# Patient Record
Sex: Male | Born: 2000 | Hispanic: Yes | Marital: Single | Smoking: Never smoker
Health system: Southern US, Community
[De-identification: ages and names within clinical notes are randomized; demographics above are authoritative.]

---

## 2018-08-03 ENCOUNTER — Emergency Department (HOSPITAL_COMMUNITY)
Admission: EM | Admit: 2018-08-03 | Discharge: 2018-08-03 | Disposition: A | Discharge status: Home / Self Care | Payer: Medicaid Other | Attending: Emergency Medicine | Admitting: Emergency Medicine

## 2018-08-03 ENCOUNTER — Emergency Department (HOSPITAL_COMMUNITY): Payer: Medicaid Other

## 2018-08-03 ENCOUNTER — Encounter (HOSPITAL_COMMUNITY): Payer: Self-pay | Admitting: Emergency Medicine

## 2018-08-03 ENCOUNTER — Other Ambulatory Visit: Payer: Self-pay

## 2018-08-03 DIAGNOSIS — Y9389 Activity, other specified: Secondary | ICD-10-CM | POA: Insufficient documentation

## 2018-08-03 DIAGNOSIS — S8251XA Displaced fracture of medial malleolus of right tibia, initial encounter for closed fracture: Secondary | ICD-10-CM | POA: Insufficient documentation

## 2018-08-03 DIAGNOSIS — Y998 Other external cause status: Secondary | ICD-10-CM | POA: Insufficient documentation

## 2018-08-03 DIAGNOSIS — S82831A Other fracture of upper and lower end of right fibula, initial encounter for closed fracture: Secondary | ICD-10-CM | POA: Insufficient documentation

## 2018-08-03 DIAGNOSIS — R52 Pain, unspecified: Secondary | ICD-10-CM

## 2018-08-03 DIAGNOSIS — S9304XA Dislocation of right ankle joint, initial encounter: Secondary | ICD-10-CM

## 2018-08-03 DIAGNOSIS — Y929 Unspecified place or not applicable: Secondary | ICD-10-CM | POA: Insufficient documentation

## 2018-08-03 DIAGNOSIS — S82891A Other fracture of right lower leg, initial encounter for closed fracture: Secondary | ICD-10-CM

## 2018-08-03 MED ORDER — HYDROCODONE-ACETAMINOPHEN 5-325 MG PO TABS
1.0000 | ORAL_TABLET | Freq: Once | ORAL | Status: AC
  Administered 2018-08-03: 1 via ORAL
  Filled 2018-08-03: qty 1

## 2018-08-03 MED ORDER — PROPOFOL 10 MG/ML IV BOLUS
0.5000 mg/kg | Freq: Once | INTRAVENOUS | Status: DC
  Filled 2018-08-03: qty 20

## 2018-08-03 MED ORDER — HYDROCODONE-ACETAMINOPHEN 5-325 MG PO TABS: 1.0000 | ORAL_TABLET | Freq: Four times a day (QID) | ORAL | 0 refills | Status: DC | PRN

## 2018-08-03 MED ORDER — PROPOFOL 10 MG/ML IV BOLUS
INTRAVENOUS | Status: AC | PRN
  Administered 2018-08-03: 20 mg via INTRAVENOUS
  Administered 2018-08-03: 40 mg via INTRAVENOUS
  Administered 2018-08-03: 20 mg via INTRAVENOUS
  Administered 2018-08-03: 40 mg via INTRAVENOUS
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
## 2018-08-03 NOTE — ED Notes (Signed)
Patient verbalizes understanding of discharge instructions. Opportunity for questioning and answers were provided. Armband removed by staff, pt discharged from ED ambulatory with crutches. ° °
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
## 2018-08-04 ENCOUNTER — Other Ambulatory Visit: Payer: Self-pay

## 2018-08-04 ENCOUNTER — Ambulatory Visit: Payer: Self-pay | Admitting: Student

## 2018-08-04 ENCOUNTER — Encounter (HOSPITAL_COMMUNITY): Payer: Self-pay | Admitting: *Deleted

## 2018-08-04 DIAGNOSIS — S82841A Displaced bimalleolar fracture of right lower leg, initial encounter for closed fracture: Secondary | ICD-10-CM | POA: Insufficient documentation
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
## 2018-08-04 NOTE — Progress Notes (Signed)
Spoke with healthy 18 yr old patient regarding his pre-op orders for DOS.  Patient denies SOB, chest pain, fever, cough, n/v.  Patient instructed to STOP now taking any Aspirin (unless otherwise instructed by your surgeon), Aleve, Naproxen, Ibuprofen, Motrin, Advil, Goody's, BC's, all herbal medications, fish oil, and all vitamins.    PCP - denies Cardiologist - denies  Chest x-ray - denies EKG - denies Stress Test -denies  ECHO - denies Cardiac Cath -denies   Sleep Study - denies CPAP - n/a   Coronavirus Screening  Have you or your Mother experienced the following symptoms:  Cough yes/no: No Fever (>100.26F)  yes/no: No Runny nose yes/no: No Sore throat yes/no: No Difficulty breathing/shortness of breath  yes/no: No  Have you or your Mother traveled in the last 14 days and where? yes/no: No  Patient informed of the hospital visitor restriction policy that is now in effect.  Patient verbalized understanding of all pre-op instructions for DOS.

## 2018-08-05 ENCOUNTER — Ambulatory Visit (HOSPITAL_COMMUNITY): Payer: Self-pay

## 2018-08-05 ENCOUNTER — Ambulatory Visit (HOSPITAL_COMMUNITY): Payer: Self-pay | Admitting: Anesthesiology

## 2018-08-05 ENCOUNTER — Encounter (HOSPITAL_COMMUNITY): Payer: Self-pay

## 2018-08-05 ENCOUNTER — Ambulatory Visit (HOSPITAL_COMMUNITY)
Admission: RE | Admit: 2018-08-05 | Discharge: 2018-08-05 | Disposition: A | Discharge status: Home / Self Care | Payer: Self-pay | Attending: Student | Admitting: Student

## 2018-08-05 ENCOUNTER — Encounter (HOSPITAL_COMMUNITY)
Admission: RE | Disposition: A | Discharge status: Home / Self Care | Payer: Self-pay | Source: Home / Self Care | Attending: Student

## 2018-08-05 ENCOUNTER — Other Ambulatory Visit: Payer: Self-pay

## 2018-08-05 DIAGNOSIS — S82841A Displaced bimalleolar fracture of right lower leg, initial encounter for closed fracture: Secondary | ICD-10-CM | POA: Insufficient documentation

## 2018-08-05 DIAGNOSIS — T148XXA Other injury of unspecified body region, initial encounter: Secondary | ICD-10-CM

## 2018-08-05 DIAGNOSIS — S93431A Sprain of tibiofibular ligament of right ankle, initial encounter: Secondary | ICD-10-CM | POA: Insufficient documentation

## 2018-08-05 HISTORY — PX: ORIF ANKLE FRACTURE: SHX5408

## 2018-08-05 LAB — CBC
HCT: 46.4 % (ref 39.0–52.0)
Hemoglobin: 14.9 g/dL (ref 13.0–17.0)
MCH: 28.4 pg (ref 26.0–34.0)
MCHC: 32.1 g/dL (ref 30.0–36.0)
MCV: 88.5 fL (ref 80.0–100.0)
Platelets: 199 10*3/uL (ref 150–400)
RBC: 5.24 MIL/uL (ref 4.22–5.81)
RDW: 11.5 % (ref 11.5–15.5)
WBC: 8.5 10*3/uL (ref 4.0–10.5)
nRBC: 0 % (ref 0.0–0.2)

## 2018-08-05 SURGERY — OPEN REDUCTION INTERNAL FIXATION (ORIF) ANKLE FRACTURE
Anesthesia: Regional | Laterality: Right

## 2018-08-05 MED ORDER — KETOROLAC TROMETHAMINE 30 MG/ML IJ SOLN: 30.0000 mg | Freq: Once | INTRAMUSCULAR | Status: DC | PRN

## 2018-08-05 MED ORDER — LIDOCAINE-EPINEPHRINE (PF) 1.5 %-1:200000 IJ SOLN
INTRAMUSCULAR | Status: DC | PRN
  Administered 2018-08-05: 15 mL via PERINEURAL

## 2018-08-05 MED ORDER — DEXAMETHASONE SODIUM PHOSPHATE 10 MG/ML IJ SOLN
INTRAMUSCULAR | Status: DC | PRN
  Administered 2018-08-05: 10 mg via INTRAVENOUS

## 2018-08-05 MED ORDER — FENTANYL CITRATE (PF) 100 MCG/2ML IJ SOLN
100.0000 ug | Freq: Once | INTRAMUSCULAR | Status: AC
  Administered 2018-08-05: 10:00:00 100 ug via INTRAVENOUS

## 2018-08-05 MED ORDER — OXYCODONE HCL 5 MG PO TABS: 5.0000 mg | ORAL_TABLET | ORAL | Status: DC | PRN

## 2018-08-05 MED ORDER — LACTATED RINGERS IV SOLN
INTRAVENOUS | Status: DC | PRN
  Administered 2018-08-05 (×2): via INTRAVENOUS

## 2018-08-05 MED ORDER — 0.9 % SODIUM CHLORIDE (POUR BTL) OPTIME
TOPICAL | Status: DC | PRN
  Administered 2018-08-05: 1000 mL

## 2018-08-05 MED ORDER — MIDAZOLAM HCL 2 MG/2ML IJ SOLN
INTRAMUSCULAR | Status: AC
  Filled 2018-08-05: qty 2

## 2018-08-05 MED ORDER — PROPOFOL 10 MG/ML IV BOLUS
INTRAVENOUS | Status: DC | PRN
  Administered 2018-08-05: 200 mg via INTRAVENOUS

## 2018-08-05 MED ORDER — ONDANSETRON HCL 4 MG/2ML IJ SOLN
INTRAMUSCULAR | Status: AC
  Filled 2018-08-05: qty 2

## 2018-08-05 MED ORDER — BACITRACIN ZINC 500 UNIT/GM EX OINT
TOPICAL_OINTMENT | CUTANEOUS | Status: AC
  Filled 2018-08-05: qty 28.35

## 2018-08-05 MED ORDER — ACETAMINOPHEN 500 MG PO TABS: 500.0000 mg | ORAL_TABLET | Freq: Two times a day (BID) | ORAL | 1 refills | Status: AC

## 2018-08-05 MED ORDER — OXYCODONE HCL 5 MG PO TABS: 5.0000 mg | ORAL_TABLET | ORAL | 0 refills | Status: AC | PRN

## 2018-08-05 MED ORDER — POVIDONE-IODINE 10 % EX SWAB
2.0000 "application " | Freq: Once | CUTANEOUS | Status: AC
  Administered 2018-08-05: 2 via TOPICAL

## 2018-08-05 MED ORDER — VANCOMYCIN HCL 1000 MG IV SOLR
INTRAVENOUS | Status: DC | PRN
  Administered 2018-08-05: 1000 mg via TOPICAL

## 2018-08-05 MED ORDER — PROPOFOL 10 MG/ML IV BOLUS
INTRAVENOUS | Status: AC
  Filled 2018-08-05: qty 20

## 2018-08-05 MED ORDER — DEXAMETHASONE SODIUM PHOSPHATE 10 MG/ML IJ SOLN
INTRAMUSCULAR | Status: AC
  Filled 2018-08-05: qty 1

## 2018-08-05 MED ORDER — CHLORHEXIDINE GLUCONATE 4 % EX LIQD: 60.0000 mL | Freq: Once | CUTANEOUS | Status: DC

## 2018-08-05 MED ORDER — BUPIVACAINE HCL (PF) 0.5 % IJ SOLN
INTRAMUSCULAR | Status: AC
  Filled 2018-08-05: qty 30

## 2018-08-05 MED ORDER — SUCCINYLCHOLINE CHLORIDE 200 MG/10ML IV SOSY
PREFILLED_SYRINGE | INTRAVENOUS | Status: DC | PRN
  Administered 2018-08-05: 100 mg via INTRAVENOUS

## 2018-08-05 MED ORDER — SUCCINYLCHOLINE CHLORIDE 200 MG/10ML IV SOSY
PREFILLED_SYRINGE | INTRAVENOUS | Status: AC
  Filled 2018-08-05: qty 10

## 2018-08-05 MED ORDER — LIDOCAINE 2% (20 MG/ML) 5 ML SYRINGE
INTRAMUSCULAR | Status: AC
  Filled 2018-08-05: qty 5

## 2018-08-05 MED ORDER — HYDROMORPHONE HCL 1 MG/ML IJ SOLN: 0.2500 mg | INTRAMUSCULAR | Status: DC | PRN

## 2018-08-05 MED ORDER — ROCURONIUM BROMIDE 50 MG/5ML IV SOSY
PREFILLED_SYRINGE | INTRAVENOUS | Status: DC | PRN
  Administered 2018-08-05: 30 mg via INTRAVENOUS

## 2018-08-05 MED ORDER — FENTANYL CITRATE (PF) 100 MCG/2ML IJ SOLN
INTRAMUSCULAR | Status: AC
  Administered 2018-08-05: 10:00:00 100 ug via INTRAVENOUS
  Filled 2018-08-05: qty 2

## 2018-08-05 MED ORDER — ASPIRIN EC 325 MG PO TBEC: 325.0000 mg | DELAYED_RELEASE_TABLET | Freq: Every day | ORAL | 0 refills | Status: AC

## 2018-08-05 MED ORDER — GABAPENTIN 100 MG PO CAPS: 100.0000 mg | ORAL_CAPSULE | Freq: Three times a day (TID) | ORAL | 0 refills | Status: DC

## 2018-08-05 MED ORDER — METHOCARBAMOL 750 MG PO TABS: 750.0000 mg | ORAL_TABLET | Freq: Four times a day (QID) | ORAL | 0 refills | Status: AC | PRN

## 2018-08-05 MED ORDER — ACETAMINOPHEN 500 MG PO TABS: 500.0000 mg | ORAL_TABLET | Freq: Two times a day (BID) | ORAL | Status: DC

## 2018-08-05 MED ORDER — PROMETHAZINE HCL 25 MG/ML IJ SOLN: 6.2500 mg | INTRAMUSCULAR | Status: DC | PRN

## 2018-08-05 MED ORDER — CEFAZOLIN SODIUM-DEXTROSE 2-4 GM/100ML-% IV SOLN
INTRAVENOUS | Status: AC
  Filled 2018-08-05: qty 100

## 2018-08-05 MED ORDER — FENTANYL CITRATE (PF) 250 MCG/5ML IJ SOLN
INTRAMUSCULAR | Status: AC
  Filled 2018-08-05: qty 5

## 2018-08-05 MED ORDER — BACITRACIN 500 UNIT/GM EX OINT
TOPICAL_OINTMENT | CUTANEOUS | Status: DC | PRN
  Administered 2018-08-05: 1 via TOPICAL

## 2018-08-05 MED ORDER — FENTANYL CITRATE (PF) 100 MCG/2ML IJ SOLN
INTRAMUSCULAR | Status: DC | PRN
  Administered 2018-08-05 (×2): 50 ug via INTRAVENOUS

## 2018-08-05 MED ORDER — VANCOMYCIN HCL 1000 MG IV SOLR
INTRAVENOUS | Status: AC
  Filled 2018-08-05: qty 1000

## 2018-08-05 MED ORDER — SUGAMMADEX SODIUM 200 MG/2ML IV SOLN
INTRAVENOUS | Status: DC | PRN
  Administered 2018-08-05: 200 mg via INTRAVENOUS

## 2018-08-05 MED ORDER — EPHEDRINE SULFATE-NACL 50-0.9 MG/10ML-% IV SOSY
PREFILLED_SYRINGE | INTRAVENOUS | Status: DC | PRN
  Administered 2018-08-05 (×2): 10 mg via INTRAVENOUS

## 2018-08-05 MED ORDER — ONDANSETRON HCL 4 MG/2ML IJ SOLN
INTRAMUSCULAR | Status: DC | PRN
  Administered 2018-08-05: 4 mg via INTRAVENOUS

## 2018-08-05 MED ORDER — BUPIVACAINE HCL (PF) 0.5 % IJ SOLN
INTRAMUSCULAR | Status: DC | PRN
  Administered 2018-08-05: 25 mL via PERINEURAL

## 2018-08-05 MED ORDER — MIDAZOLAM HCL 5 MG/5ML IJ SOLN
INTRAMUSCULAR | Status: DC | PRN
  Administered 2018-08-05: 2 mg via INTRAVENOUS

## 2018-08-05 MED ORDER — ROCURONIUM BROMIDE 50 MG/5ML IV SOSY
PREFILLED_SYRINGE | INTRAVENOUS | Status: AC
  Filled 2018-08-05: qty 5

## 2018-08-05 MED ORDER — CEFAZOLIN SODIUM-DEXTROSE 2-4 GM/100ML-% IV SOLN
2.0000 g | INTRAVENOUS | Status: AC
  Administered 2018-08-05: 2 g via INTRAVENOUS

## 2018-08-05 MED ORDER — LACTATED RINGERS IV SOLN
INTRAVENOUS | Status: DC
  Administered 2018-08-05: 08:00:00 via INTRAVENOUS

## 2018-08-05 MED ORDER — LIDOCAINE 2% (20 MG/ML) 5 ML SYRINGE
INTRAMUSCULAR | Status: DC | PRN
  Administered 2018-08-05: 60 mg via INTRAVENOUS

## 2018-08-05 MED ORDER — DEXMEDETOMIDINE HCL 200 MCG/2ML IV SOLN
INTRAVENOUS | Status: DC | PRN
  Administered 2018-08-05 (×2): 4 ug via INTRAVENOUS

## 2018-08-05 MED ORDER — MIDAZOLAM HCL 2 MG/2ML IJ SOLN
2.0000 mg | Freq: Once | INTRAMUSCULAR | Status: AC
  Administered 2018-08-05: 2 mg via INTRAVENOUS

## 2018-08-05 SURGICAL SUPPLY — 68 items
BANDAGE ACE 4X5 VEL STRL LF (GAUZE/BANDAGES/DRESSINGS) ×3 IMPLANT
BANDAGE ACE 6X5 VEL STRL LF (GAUZE/BANDAGES/DRESSINGS) IMPLANT
BANDAGE ESMARK 6X9 LF (GAUZE/BANDAGES/DRESSINGS) IMPLANT
BIT DRILL 2.5 X LONG (BIT) ×1
BIT DRILL 2.5X110 QC LCP DISP (BIT) ×3 IMPLANT
BIT DRILL PERC QC 2.8X200 100 (BIT) ×1 IMPLANT
BIT DRILL X LONG 2.5 (BIT) ×1 IMPLANT
BNDG COHESIVE 4X5 TAN STRL (GAUZE/BANDAGES/DRESSINGS) ×3 IMPLANT
BNDG ELASTIC 6X10 VLCR STRL LF (GAUZE/BANDAGES/DRESSINGS) ×3 IMPLANT
BNDG ESMARK 6X9 LF (GAUZE/BANDAGES/DRESSINGS)
BRUSH SCRUB SURG 4.25 DISP (MISCELLANEOUS) ×6 IMPLANT
CHLORAPREP W/TINT 26ML (MISCELLANEOUS) ×6 IMPLANT
COVER SURGICAL LIGHT HANDLE (MISCELLANEOUS) ×3 IMPLANT
COVER WAND RF STERILE (DRAPES) IMPLANT
DRAPE C-ARM 42X72 X-RAY (DRAPES) ×2 IMPLANT
DRAPE C-ARMOR (DRAPES) ×3 IMPLANT
DRAPE ORTHO SPLIT 77X108 STRL (DRAPES) ×4
DRAPE SURG ORHT 6 SPLT 77X108 (DRAPES) ×2 IMPLANT
DRAPE U-SHAPE 47X51 STRL (DRAPES) ×3 IMPLANT
DRILL BIT QUICK COUP 2.8MM 100 (BIT) ×2
DRILL BIT X LONG 2.5 (BIT) ×2
DRSG ADAPTIC 3X8 NADH LF (GAUZE/BANDAGES/DRESSINGS) ×3 IMPLANT
ELECT REM PT RETURN 9FT ADLT (ELECTROSURGICAL) ×3
ELECTRODE REM PT RTRN 9FT ADLT (ELECTROSURGICAL) ×1 IMPLANT
GAUZE SPONGE 4X4 12PLY STRL (GAUZE/BANDAGES/DRESSINGS) IMPLANT
GAUZE SPONGE 4X4 12PLY STRL LF (GAUZE/BANDAGES/DRESSINGS) ×3 IMPLANT
GLOVE BIO SURGEON STRL SZ 6.5 (GLOVE) ×6 IMPLANT
GLOVE BIO SURGEON STRL SZ7.5 (GLOVE) ×9 IMPLANT
GLOVE BIO SURGEONS STRL SZ 6.5 (GLOVE) ×3
GLOVE BIOGEL PI IND STRL 6.5 (GLOVE) ×1 IMPLANT
GLOVE BIOGEL PI IND STRL 7.5 (GLOVE) ×1 IMPLANT
GLOVE BIOGEL PI INDICATOR 6.5 (GLOVE) ×2
GLOVE BIOGEL PI INDICATOR 7.5 (GLOVE) ×2
GOWN STRL REUS W/ TWL LRG LVL3 (GOWN DISPOSABLE) ×2 IMPLANT
GOWN STRL REUS W/TWL LRG LVL3 (GOWN DISPOSABLE) ×4
KIT TURNOVER KIT B (KITS) ×3 IMPLANT
MANIFOLD NEPTUNE II (INSTRUMENTS) IMPLANT
NEEDLE 25GAX1.5 (MISCELLANEOUS) ×3 IMPLANT
NEEDLE HYPO 21X1.5 SAFETY (NEEDLE) IMPLANT
NS IRRIG 1000ML POUR BTL (IV SOLUTION) ×3 IMPLANT
PACK TOTAL JOINT (CUSTOM PROCEDURE TRAY) ×3 IMPLANT
PAD ABD 8X10 STRL (GAUZE/BANDAGES/DRESSINGS) ×3 IMPLANT
PAD ARMBOARD 7.5X6 YLW CONV (MISCELLANEOUS) ×6 IMPLANT
PAD CAST 4YDX4 CTTN HI CHSV (CAST SUPPLIES) ×2 IMPLANT
PADDING CAST COTTON 4X4 STRL (CAST SUPPLIES) ×4
PADDING CAST COTTON 6X4 STRL (CAST SUPPLIES) IMPLANT
PLATE LCP 3.5 1/3 TUB 6HX69 (Plate) ×3 IMPLANT
SCREW CANC FT 4.0X50 (Screw) ×3 IMPLANT
SCREW CORTEX 3.5 16MM (Screw) ×2 IMPLANT
SCREW CORTEX 3.5 18MM (Screw) ×4 IMPLANT
SCREW CORTEX 3.5 20MM (Screw) ×2 IMPLANT
SCREW LOCK CORT ST 3.5X16 (Screw) ×1 IMPLANT
SCREW LOCK CORT ST 3.5X18 (Screw) ×2 IMPLANT
SCREW LOCK CORT ST 3.5X20 (Screw) ×1 IMPLANT
SPONGE LAP 18X18 RF (DISPOSABLE) IMPLANT
STAPLER VISISTAT 35W (STAPLE) ×3 IMPLANT
SUCTION FRAZIER HANDLE 10FR (MISCELLANEOUS) ×2
SUCTION TUBE FRAZIER 10FR DISP (MISCELLANEOUS) ×1 IMPLANT
SUT ETHILON 3 0 PS 1 (SUTURE) ×6 IMPLANT
SUT VIC AB 0 CT1 27 (SUTURE) ×2
SUT VIC AB 0 CT1 27XBRD ANBCTR (SUTURE) ×1 IMPLANT
SUT VIC AB 2-0 CT1 27 (SUTURE) ×4
SUT VIC AB 2-0 CT1 TAPERPNT 27 (SUTURE) ×2 IMPLANT
SYR CONTROL 10ML LL (SYRINGE) ×3 IMPLANT
TOWEL OR 17X24 6PK STRL BLUE (TOWEL DISPOSABLE) ×3 IMPLANT
TOWEL OR 17X26 10 PK STRL BLUE (TOWEL DISPOSABLE) ×6 IMPLANT
UNDERPAD 30X30 (UNDERPADS AND DIAPERS) ×3 IMPLANT
WATER STERILE IRR 1000ML POUR (IV SOLUTION) IMPLANT

## 2018-08-05 NOTE — Discharge Instructions (Addendum)
Orthopaedic Trauma Service Discharge Instructions   General Discharge Instructions  WEIGHT BEARING STATUS: Non weightbearing right leg  RANGE OF MOTION/ACTIVITY: Full knee range of motion  Wound Care: Do not remove splint until follow up   DVT/PE prophylaxis: Aspirin 325 mg daily  Diet: as you were eating previously.  Can use over the counter stool softeners and bowel preparations, such as Miralax, to help with bowel movements.  Narcotics can be constipating.  Be sure to drink plenty of fluids  PAIN MEDICATION USE AND EXPECTATIONS  You have likely been given narcotic medications to help control your pain.  After a traumatic event that results in an fracture (broken bone) with or without surgery, it is ok to use narcotic pain medications to help control one's pain.  We understand that everyone responds to pain differently and each individual patient will be evaluated on a regular basis for the continued need for narcotic medications. Ideally, narcotic medication use should last no more than 6-8 weeks (coinciding with fracture healing).   As a patient it is your responsibility as well to monitor narcotic medication use and report the amount and frequency you use these medications when you come to your office visit.   We would also advise that if you are using narcotic medications, you should take a dose prior to therapy to maximize you participation.  IF YOU ARE ON NARCOTIC MEDICATIONS IT IS NOT PERMISSIBLE TO OPERATE A MOTOR VEHICLE (MOTORCYCLE/CAR/TRUCK/MOPED) OR HEAVY MACHINERY DO NOT MIX NARCOTICS WITH OTHER CNS (CENTRAL NERVOUS SYSTEM) DEPRESSANTS SUCH AS ALCOHOL   STOP SMOKING OR USING NICOTINE PRODUCTS!!!!  As discussed nicotine severely impairs your body's ability to heal surgical and traumatic wounds but also impairs bone healing.  Wounds and bone heal by forming microscopic blood vessels (angiogenesis) and nicotine is a vasoconstrictor (essentially, shrinks blood vessels).   Therefore, if vasoconstriction occurs to these microscopic blood vessels they essentially disappear and are unable to deliver necessary nutrients to the healing tissue.  This is one modifiable factor that you can do to dramatically increase your chances of healing your injury.    (This means no smoking, no nicotine gum, patches, etc)  DO NOT USE NONSTEROIDAL ANTI-INFLAMMATORY DRUGS (NSAID'S)  Using products such as Advil (ibuprofen), Aleve (naproxen), Motrin (ibuprofen) for additional pain control during fracture healing can delay and/or prevent the healing response.  If you would like to take over the counter (OTC) medication, Tylenol (acetaminophen) is ok.  However, some narcotic medications that are given for pain control contain acetaminophen as well. Therefore, you should not exceed more than 4000 mg of tylenol in a day if you do not have liver disease.  Also note that there are may OTC medicines, such as cold medicines and allergy medicines that my contain tylenol as well.  If you have any questions about medications and/or interactions please ask your doctor/PA or your pharmacist.      ICE AND ELEVATE INJURED/OPERATIVE EXTREMITY  Using ice and elevating the injured extremity above your heart can help with swelling and pain control.  Icing in a pulsatile fashion, such as 20 minutes on and 20 minutes off, can be followed.    Do not place ice directly on skin. Make sure there is a barrier between to skin and the ice pack.    Using frozen items such as frozen peas works well as the conform nicely to the are that needs to be iced.  USE AN ACE WRAP OR TED HOSE FOR SWELLING CONTROL  In addition to icing and elevation, Ace wraps or TED hose are used to help limit and resolve swelling.  It is recommended to use Ace wraps or TED hose until you are informed to stop.    When using Ace Wraps start the wrapping distally (farthest away from the body) and wrap proximally (closer to the body)   Example: If you  had surgery on your leg or thing and you do not have a splint on, start the ace wrap at the toes and work your way up to the thigh        If you had surgery on your upper extremity and do not have a splint on, start the ace wrap at your fingers and work your way up to the upper arm  IF YOU ARE IN A SPLINT OR CAST DO NOT REMOVE IT FOR ANY REASON   If your splint gets wet for any reason please contact the office immediately. You may shower in your splint or cast as long as you keep it dry.  This can be done by wrapping in a cast cover or garbage back (or similar)  Do Not stick any thing down your splint or cast such as pencils, money, or hangers to try and scratch yourself with.  If you feel itchy take benadryl as prescribed on the bottle for itching   CALL THE OFFICE WITH ANY QUESTIONS OR CONCERNS: [phone] or visit our website at [email or web address]      Discharge Wound Care Instructions  Do NOT apply any ointments, solutions or lotions to pin sites or surgical wounds.  These prevent needed drainage and even though solutions like hydrogen peroxide kill bacteria, they also damage cells lining the pin sites that help fight infection.  Applying lotions or ointments can keep the wounds moist and can cause them to breakdown and open up as well. This can increase the risk for infection. When in doubt call the office.  Surgical incisions should be dressed daily.  If any drainage is noted, use one layer of adaptic, then gauze, Kerlix, and an ace wrap.  Once the incision is completely dry and without drainage, it may be left open to air out.  Showering may begin 36-48 hours later.  Cleaning gently with soap and water.  Traumatic wounds should be dressed daily as well.    One layer of adaptic, gauze, Kerlix, then ace wrap.  The adaptic can be discontinued once the draining has ceased    If you have a wet to dry dressing: wet the gauze with saline the squeeze as much saline out so the gauze is  moist (not soaking wet), place moistened gauze over wound, then place a dry gauze over the moist one, followed by Kerlix wrap, then ace wrap.

## 2018-08-05 NOTE — Transfer of Care (Signed)
Immediate Anesthesia Transfer of Care Note  Patient: Randy Dennie Bible  Procedure(s) Performed: OPEN REDUCTION INTERNAL FIXATION (ORIF) ANKLE FRACTURE (Right )  Patient Location: PACU  Anesthesia Type:General and Regional  Level of Consciousness: drowsy and patient cooperative  Airway & Oxygen Therapy: Patient Spontanous Breathing and Patient connected to face mask oxygen  Post-op Assessment: Report given to RN and Post -op Vital signs reviewed and stable  Post vital signs: Reviewed and stable  Last Vitals:  Vitals Value Taken Time  BP 108/81 08/05/2018 12:16 PM  Temp    Pulse 94 08/05/2018 12:18 PM  Resp 16 08/05/2018 12:18 PM  SpO2 100 % 08/05/2018 12:18 PM  Vitals shown include unvalidated device data.  Last Pain:  Vitals:   08/05/18 0742  TempSrc: Oral      Patients Stated Pain Goal: 2 (08/05/18 0737)  Complications: No apparent anesthesia complications

## 2018-08-05 NOTE — Anesthesia Procedure Notes (Signed)
Anesthesia Procedure Image    
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
## 2018-08-05 NOTE — Anesthesia Procedure Notes (Signed)
Procedure Name: Intubation Date/Time: 08/05/2018 10:33 AM Performed by: Julian Reil, CRNA Pre-anesthesia Checklist: Patient identified, Emergency Drugs available, Suction available and Patient being monitored Patient Re-evaluated:Patient Re-evaluated prior to induction Oxygen Delivery Method: Circle system utilized Preoxygenation: Pre-oxygenation with 100% oxygen Induction Type: IV induction and Rapid sequence Laryngoscope Size: Miller and 3 Grade View: Grade I Tube type: Oral Tube size: 7.5 mm Number of attempts: 1 Airway Equipment and Method: Stylet Placement Confirmation: ETT inserted through vocal cords under direct vision,  positive ETCO2 and breath sounds checked- equal and bilateral Secured at: 23 cm Tube secured with: Tape Dental Injury: Teeth and Oropharynx as per pre-operative assessment  Comments: RSI due to Covid-19 pandemic concerns.  4x4s bite block used.
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
## 2018-08-05 NOTE — Anesthesia Procedure Notes (Signed)
Anesthesia Regional Block: Popliteal block   Pre-Anesthetic Checklist: ,, timeout performed, Correct Patient, Correct Site, Correct Laterality, Correct Procedure, Correct Position, site marked, Risks and benefits discussed,  Surgical consent,  Pre-op evaluation,  At surgeon's request and post-op pain management  Laterality: Right  Prep: chloraprep       Needles:  Injection technique: Single-shot  Needle Type: Echogenic Needle     Needle Length: 9cm      Additional Needles:   Procedures:,,,, ultrasound used (permanent image in chart),,,,  Narrative:  Start time: 08/05/2018 9:34 AM End time: 08/05/2018 9:42 AM Injection made incrementally with aspirations every 5 mL.  Performed by: Personally  Anesthesiologist: Eilene Ghazi, MD  Additional Notes: Patient tolerated the procedure well without complications

## 2018-08-05 NOTE — Anesthesia Postprocedure Evaluation (Signed)
Anesthesia Post Note  Patient: Randy Parks  Procedure(s) Performed: OPEN REDUCTION INTERNAL FIXATION (ORIF) ANKLE FRACTURE (Right )     Patient location during evaluation: PACU Anesthesia Type: General Level of consciousness: awake and alert Pain management: pain level controlled Vital Signs Assessment: post-procedure vital signs reviewed and stable Respiratory status: spontaneous breathing, nonlabored ventilation, respiratory function stable and patient connected to nasal cannula oxygen Cardiovascular status: blood pressure returned to baseline and stable Postop Assessment: no apparent nausea or vomiting Anesthetic complications: no    Last Vitals:  Vitals:   08/05/18 1215 08/05/18 1234  BP:  (P) 130/63  Pulse:  (P) 100  Resp:  (P) 14  Temp: (P) 36.7 C   SpO2:      Last Pain:  Vitals:   08/05/18 1234  TempSrc:   PainSc: (P) 0-No pain                 Omega Durante S

## 2018-08-08 ENCOUNTER — Encounter (HOSPITAL_COMMUNITY): Payer: Self-pay | Admitting: Student

## 2018-08-10 ENCOUNTER — Encounter: Payer: Self-pay | Admitting: Student

## 2018-08-10 DIAGNOSIS — S93431A Sprain of tibiofibular ligament of right ankle, initial encounter: Secondary | ICD-10-CM | POA: Insufficient documentation

## 2019-02-10 ENCOUNTER — Emergency Department (HOSPITAL_COMMUNITY)
Admission: EM | Admit: 2019-02-10 | Discharge: 2019-02-10 | Disposition: A | Discharge status: Home / Self Care | Payer: Self-pay | Attending: Emergency Medicine | Admitting: Emergency Medicine

## 2019-02-10 ENCOUNTER — Other Ambulatory Visit: Payer: Self-pay

## 2019-02-10 ENCOUNTER — Encounter (HOSPITAL_COMMUNITY): Payer: Self-pay

## 2019-02-10 ENCOUNTER — Emergency Department (HOSPITAL_COMMUNITY): Payer: Self-pay

## 2019-02-10 DIAGNOSIS — N200 Calculus of kidney: Secondary | ICD-10-CM | POA: Insufficient documentation

## 2019-02-10 LAB — CBC
HCT: 45.4 % (ref 39.0–52.0)
Hemoglobin: 14.9 g/dL (ref 13.0–17.0)
MCH: 29.8 pg (ref 26.0–34.0)
MCHC: 32.8 g/dL (ref 30.0–36.0)
MCV: 90.8 fL (ref 80.0–100.0)
Platelets: 246 10*3/uL (ref 150–400)
RBC: 5 MIL/uL (ref 4.22–5.81)
RDW: 11.8 % (ref 11.5–15.5)
WBC: 4.5 10*3/uL (ref 4.0–10.5)
nRBC: 0 % (ref 0.0–0.2)

## 2019-02-10 LAB — COMPREHENSIVE METABOLIC PANEL
ALT: 28 U/L (ref 0–44)
AST: 36 U/L (ref 15–41)
Albumin: 4.9 g/dL (ref 3.5–5.0)
Alkaline Phosphatase: 82 U/L (ref 38–126)
Anion gap: 11 (ref 5–15)
BUN: 9 mg/dL (ref 6–20)
CO2: 28 mmol/L (ref 22–32)
Calcium: 9.5 mg/dL (ref 8.9–10.3)
Chloride: 100 mmol/L (ref 98–111)
Creatinine, Ser: 0.95 mg/dL (ref 0.61–1.24)
GFR calc Af Amer: >60 mL/min (ref >60)
GFR calc non Af Amer: >60 mL/min (ref >60)
Glucose, Bld: 107 mg/dL — ABNORMAL HIGH (ref 70–99)
Potassium: 3.9 mmol/L (ref 3.5–5.1)
Sodium: 139 mmol/L (ref 135–145)
Total Bilirubin: 0.8 mg/dL (ref 0.3–1.2)
Total Protein: 7.7 g/dL (ref 6.5–8.1)

## 2019-02-10 LAB — URINALYSIS, ROUTINE W REFLEX MICROSCOPIC
Bilirubin Urine: NEGATIVE
Glucose, UA: NEGATIVE mg/dL
Ketones, ur: NEGATIVE mg/dL
Leukocytes,Ua: NEGATIVE
Nitrite: NEGATIVE
Protein, ur: NEGATIVE mg/dL
Specific Gravity, Urine: 1.005 (ref 1.005–1.030)
pH: 7 (ref 5.0–8.0)

## 2019-02-10 LAB — RPR: RPR Ser Ql: NONREACTIVE

## 2019-02-10 LAB — HIV ANTIBODY (ROUTINE TESTING W REFLEX): HIV Screen 4th Generation wRfx: NONREACTIVE

## 2019-02-10 MED ORDER — ONDANSETRON 4 MG PO TBDP: 4.0000 mg | ORAL_TABLET | Freq: Three times a day (TID) | ORAL | 0 refills | Status: AC | PRN

## 2019-02-10 MED ORDER — SODIUM CHLORIDE 0.9% FLUSH
3.0000 mL | Freq: Once | INTRAVENOUS | Status: AC
  Administered 2019-02-10: 09:00:00 3 mL via INTRAVENOUS

## 2019-02-10 MED ORDER — MORPHINE SULFATE (PF) 2 MG/ML IV SOLN
2.0000 mg | Freq: Once | INTRAVENOUS | Status: AC
  Administered 2019-02-10: 2 mg via INTRAVENOUS
  Filled 2019-02-10: qty 1

## 2019-02-10 MED ORDER — KETOROLAC TROMETHAMINE 15 MG/ML IJ SOLN
15.0000 mg | Freq: Once | INTRAMUSCULAR | Status: AC
  Administered 2019-02-10: 15 mg via INTRAVENOUS
  Filled 2019-02-10: qty 1

## 2019-02-10 MED ORDER — SODIUM CHLORIDE 0.9 % IV BOLUS
1000.0000 mL | Freq: Once | INTRAVENOUS | Status: AC
  Administered 2019-02-10: 09:00:00 1000 mL via INTRAVENOUS

## 2019-02-10 MED ORDER — TAMSULOSIN HCL 0.4 MG PO CAPS: 0.4000 mg | ORAL_CAPSULE | Freq: Every day | ORAL | 0 refills | Status: AC

## 2019-02-10 NOTE — Discharge Instructions (Addendum)
You have been diagnosed today with Kidney Stone.  At this time there does not appear to be the presence of an emergent medical condition, however there is always the potential for conditions to change. Please read and follow the below instructions.  Please return to the Emergency Department immediately for any new or worsening symptoms or if your symptoms do not improve within 2 days. Please be sure to follow up with your Primary Care Provider within one week regarding your visit today; please call their office to schedule an appointment even if you are feeling better for a follow-up visit. You have a 5 mm kidney stone on your CT scan today.  Please take the medications Flomax as prescribed to help facilitate stone passage. You may use the medication Zofran as prescribed to help with nausea and vomiting.  Please call the urologist Dr. Jeffie Pollock on your discharge paperwork to schedule a follow-up appointment regarding your kidney stone. You have been given an NSAID-containing medication called Toradol today.  Do not take the medications including ibuprofen, Aleve, Advil, naproxen or other NSAID-containing medications for the next 2 days.  Please be sure to drink plenty of water over the next few days. You have been tested today for gonorrhea and chlamydia as well as HIV and syphilis. These results will be available in approximately 3 days. You may check your MyChart account for results. Please inform all sexual partners of positive results and that they should be tested and treated as well. Please wait 2 weeks and be sure that you and your partners are symptom free before returning to sexual activity. Please use protection with every sexual encounter.  Please follow-up with your primary care provider for treatment of any positive STD test.  Get help right away if: You have a fever or chills. You get very bad pain. You get new pain in your belly (abdomen). You pass out (faint). You have pain or swelling of  your testicles You cannot pee. You have any new/concerning or worsening symptoms  Please read the additional information packets attached to your discharge summary.  Do not take your medicine if  develop an itchy rash, swelling in your mouth or lips, or difficulty breathing; call 911 and seek immediate emergency medical attention if this occurs.  Note: Portions of this text may have been transcribed using voice recognition software. Every effort was made to ensure accuracy; however, inadvertent computerized transcription errors may still be present.

## 2019-02-10 NOTE — ED Provider Notes (Signed)
[facility]-EMERGENCY DEPT Provider Note   CSN: [id number] Arrival date & time: 02/10/19  0754     History   Chief Complaint Chief Complaint  Patient presents with  . Abdominal Pain  . Hematuria    HPI Randy Parks is a 18 y.o. male otherwise healthy presents today for abdominal pain and hematuria.  Patient woke up from sleep around 4 AM with some suprapubic abdominal pain, he presented to the ER and had 1 episode of hematuria.  He describes a moderate intensity waxing and waning throbbing sensation without clear aggravating or alleviating factors, no radiation of pain.  Reports he has never had similar pain in the past.  Denies fever/chills, headache, chest pain/shortness of breath, nausea/vomiting, constipation/diarrhea, testicular pain/swelling, dysuria, penile discharge, lesions/rash or concern for STI.  Patient reports he has not had sexual intercourse in 2 months and always uses protection.     HPI  History reviewed. No pertinent past medical history.  Patient Active Problem List   Diagnosis Date Noted  . Ankle syndesmosis disruption, right, initial encounter 08/10/2018  . Bimalleolar ankle fracture, right, closed, initial encounter 08/04/2018    Past Surgical History:  Procedure Laterality Date  . ORIF ANKLE FRACTURE Right 08/05/2018   Procedure: OPEN REDUCTION INTERNAL FIXATION (ORIF) ANKLE FRACTURE;  Surgeon: Roby LoftsHaddix, Kevin P, MD;  Location: [facility] OR;  Service: Orthopedics;  Laterality: Right;        Home Medications    Prior to Admission medications   Medication Sig Start Date End Date Taking? Authorizing Provider  methocarbamol (ROBAXIN-750) 750 MG tablet Take 1 tablet (750 mg total) by mouth every 6 (six) hours as needed for muscle spasms. Patient not taking: Reported on 02/10/2019 08/05/18   Despina HiddenYacobi, Sarah A, PA-C  ondansetron (ZOFRAN ODT) 4 MG disintegrating tablet Take 1 tablet (4 mg total) by mouth every 8 (eight) hours as  needed for nausea or vomiting. 02/10/19   Harlene SaltsMorelli, Jamieon Lannen A, PA-C  oxyCODONE (ROXICODONE) 5 MG immediate release tablet Take 1 tablet (5 mg total) by mouth every 4 (four) hours as needed for moderate pain or severe pain. Patient not taking: Reported on 02/10/2019 08/05/18   Despina HiddenYacobi, Sarah A, PA-C  tamsulosin (FLOMAX) 0.4 MG CAPS capsule Take 1 capsule (0.4 mg total) by mouth daily for 5 days. 02/10/19 02/15/19  Bill SalinasMorelli, Richards Pherigo A, PA-C    Family History History reviewed. No pertinent family history.  Social History Social History   Tobacco Use  . Smoking status: Never Smoker  . Smokeless tobacco: Never Used  Substance Use Topics  . Alcohol use: Not Currently  . Drug use: Not Currently     Allergies   Patient has no known allergies.   Review of Systems Review of Systems Ten systems are reviewed and are negative for acute change except as noted in the HPI   Physical Exam Updated Vital Signs BP (!) 149/62 (BP Location: Right Arm)   Pulse (!) 55   Temp 98.3 F (36.8 C) (Oral)   Resp 16   Ht 6\' 2"  (1.88 m)   Wt 79.4 kg   SpO2 100%   BMI 22.47 kg/m   Physical Exam Constitutional:      General: He is not in acute distress.    Appearance: Normal appearance. He is well-developed. He is not ill-appearing or diaphoretic.  HENT:     Head: Normocephalic and atraumatic.     Right Ear: External ear normal.     Left Ear: External ear normal.  Nose: Nose normal.  Eyes:     General: Vision grossly intact. Gaze aligned appropriately.     Pupils: Pupils are equal, round, and reactive to light.  Neck:     Musculoskeletal: Normal range of motion.     Trachea: Trachea and phonation normal. No tracheal deviation.  Cardiovascular:     Rate and Rhythm: Normal rate and regular rhythm.     Heart sounds: Normal heart sounds.  Pulmonary:     Effort: Pulmonary effort is normal. No respiratory distress.  Abdominal:     General: There is no distension.     Palpations: Abdomen is soft.      Tenderness: There is abdominal tenderness in the suprapubic area. There is no right CVA tenderness, left CVA tenderness, guarding or rebound.  Genitourinary:    Comments: Chaperone present during genital exam Personal assistant.  No external genital lesions noted, no bumps on head of penis, specifically no vesicles concerning for herpes or chancre suggestive of syphilis.  No pain with palpation of the penis/glans, no discharge or urethritis noted.  Scrotum and testicles without erythema/swelling or tenderness to palpation. Cremasteric reflex intact bilaterally. No palpable hernia noted.  Musculoskeletal: Normal range of motion.  Skin:    General: Skin is warm and dry.  Neurological:     Mental Status: He is alert.     GCS: GCS eye subscore is 4. GCS verbal subscore is 5. GCS motor subscore is 6.     Comments: Speech is clear and goal oriented, follows commands Major Cranial nerves without deficit, no facial droop Moves extremities without ataxia, coordination intact  Psychiatric:        Behavior: Behavior normal.    ED Treatments / Results  Labs (all labs ordered are listed, but only abnormal results are displayed) Labs Reviewed  COMPREHENSIVE METABOLIC PANEL - Abnormal; Notable for the following components:      Result Value   Glucose, Bld 107 (*)    All other components within normal limits  URINALYSIS, ROUTINE W REFLEX MICROSCOPIC - Abnormal; Notable for the following components:   Hgb urine dipstick LARGE (*)    Bacteria, UA RARE (*)    All other components within normal limits  CBC  RPR  HIV ANTIBODY (ROUTINE TESTING W REFLEX)  HIV4GL SAVE TUBE  GC/CHLAMYDIA PROBE AMP ([place]) NOT AT [facility]    EKG None  Radiology Ct Renal Stone Study  Result Date: 02/10/2019 CLINICAL DATA:  Hematuria. EXAM: CT ABDOMEN AND PELVIS WITHOUT CONTRAST TECHNIQUE: Multidetector CT imaging of the abdomen and pelvis was performed following the standard protocol without IV contrast. COMPARISON:   None. FINDINGS: Lower chest: No acute abnormality. Hepatobiliary: No focal liver abnormality is seen. No gallstones, gallbladder wall thickening, or biliary dilatation. Pancreas: Unremarkable. No pancreatic ductal dilatation or surrounding inflammatory changes. Spleen: Normal in size without focal abnormality. Adrenals/Urinary Tract: Bilateral adrenal glands are normal. Nonobstructing stone is identified in the left kidney. Moderate left hydroureteronephrosis is noted due to obstruction by 5 mm stone in the left ureteral vesicular junction. The right kidney is normal. There is no right hydronephrosis. The bladder is normal. Stomach/Bowel: Stomach is within normal limits. Appendix appears normal. No evidence of bowel wall thickening, distention, or inflammatory changes. Vascular/Lymphatic: No significant vascular findings are present. No enlarged abdominal or pelvic lymph nodes. Reproductive: Prostate is unremarkable. Other: None. Musculoskeletal: No acute findings. IMPRESSION: Moderate left hydroureteronephrosis due to obstruction by 5 mm stone in the left ureteral vesicular junction. Electronically Signed  By: Sherian Rein M.D.   On: 02/10/2019 10:16    Procedures Procedures (including critical care time)  Medications Ordered in ED Medications  sodium chloride flush (NS) 0.9 % injection 3 mL (3 mLs Intravenous Given 02/10/19 0854)  sodium chloride 0.9 % bolus 1,000 mL (0 mLs Intravenous Stopped 02/10/19 1041)  morphine 2 MG/ML injection 2 mg (2 mg Intravenous Given 02/10/19 0852)  ketorolac (TORADOL) 15 MG/ML injection 15 mg (15 mg Intravenous Given 02/10/19 1115)     Initial Impression / Assessment and Plan / ED Course  I have reviewed the triage vital signs and the nursing notes.  Pertinent labs & imaging results that were available during my care of the patient were reviewed by me and considered in my medical decision making (see chart for details).    Urinalysis with large hemoglobin, 21-50  RBC, rare bacteria, 0-5 WBCs, nitrite and leukocyte negative CMP nonacute CBC within normal limits CT renal stone study:  IMPRESSION:  Moderate left hydroureteronephrosis due to obstruction by 5 mm stone  in the left ureteral vesicular junction.  - Additionally patient requested STI testing today.  He chooses to defer treatment until test result, seems reasonable today as he is no symptoms and work-up today is consistent with kidney stone.  Patient encouraged to abstain from intercourse until test results are available and are negative, he will follow-up with his PCP for any positive tests.  Discussed safe sex with patient and he states understanding. - Patient reevaluated resting comfortably no acute distress states improvement of pain following morphine today.  Informed of results as above and he states understanding.  He has been given Toradol shot for remaining pain.  He has been prescribed Flomax and Zofran and can continue at home with OTC Tylenol.  He has been given referral to urology for follow-up encouraged to call their office today to schedule an appointment.  Indication for antibiotics at this time.  At this time there does not appear to be any evidence of an acute emergency medical condition and the patient appears stable for discharge with appropriate outpatient follow up. Diagnosis was discussed with patient who verbalizes understanding of care plan and is agreeable to discharge. I have discussed return precautions with patient who verbalizes understanding of return precautions. Patient encouraged to follow-up with their PCP and urology. All questions answered.  Patient has been discharged in good condition.  Patient's case discussed with Dr. Vivia Ewing who agrees with plan to discharge with outpatient follow-up.   Note: Portions of this report may have been transcribed using voice recognition software. Every effort was made to ensure accuracy; however, inadvertent computerized  transcription errors may still be present. Final Clinical Impressions(s) / ED Diagnoses   Final diagnoses:  Kidney stone    ED Discharge Orders         Ordered    tamsulosin (FLOMAX) 0.4 MG CAPS capsule  Daily     02/10/19 1103    ondansetron (ZOFRAN ODT) 4 MG disintegrating tablet  Every 8 hours PRN     02/10/19 1103           Bill Salinas, PA-C 02/10/19 1127    Derwood Kaplan, MD 02/10/19 1137

## 2019-02-10 NOTE — ED Triage Notes (Signed)
Pt from home, states he's been having abdominal pain (6/10 pain) starting around 400-500 this morning. Pt has 1 occurrence of hematuria in the ED. Pt is ambulatory, a&ox4.

## 2019-02-10 NOTE — ED Triage Notes (Signed)
Patient c/o abdominal pain and states he went to the bathroom in the lobby and states he had hematuria.

## 2019-02-20 ENCOUNTER — Encounter (HOSPITAL_COMMUNITY): Payer: Self-pay | Admitting: Emergency Medicine

## 2019-02-20 ENCOUNTER — Other Ambulatory Visit: Payer: Self-pay

## 2019-02-20 ENCOUNTER — Emergency Department (HOSPITAL_COMMUNITY)
Admission: EM | Admit: 2019-02-20 | Discharge: 2019-02-20 | Disposition: A | Discharge status: Home / Self Care | Payer: Medicaid Other | Attending: Emergency Medicine | Admitting: Emergency Medicine

## 2019-02-20 DIAGNOSIS — N200 Calculus of kidney: Secondary | ICD-10-CM | POA: Insufficient documentation

## 2019-02-20 LAB — URINALYSIS, ROUTINE W REFLEX MICROSCOPIC
Bacteria, UA: NONE SEEN
Bilirubin Urine: NEGATIVE
Glucose, UA: NEGATIVE mg/dL
Ketones, ur: 5 mg/dL — AB
Leukocytes,Ua: NEGATIVE
Nitrite: NEGATIVE
Protein, ur: 30 mg/dL — AB
Specific Gravity, Urine: 1.014 (ref 1.005–1.030)
pH: 7 (ref 5.0–8.0)

## 2019-02-20 LAB — BASIC METABOLIC PANEL
Anion gap: 12 (ref 5–15)
BUN: 11 mg/dL (ref 6–20)
CO2: 25 mmol/L (ref 22–32)
Calcium: 9.7 mg/dL (ref 8.9–10.3)
Chloride: 100 mmol/L (ref 98–111)
Creatinine, Ser: 1.33 mg/dL — ABNORMAL HIGH (ref 0.61–1.24)
GFR calc Af Amer: >60 mL/min (ref >60)
GFR calc non Af Amer: >60 mL/min (ref >60)
Glucose, Bld: 114 mg/dL — ABNORMAL HIGH (ref 70–99)
Potassium: 3.8 mmol/L (ref 3.5–5.1)
Sodium: 137 mmol/L (ref 135–145)

## 2019-02-20 LAB — CBC WITH DIFFERENTIAL/PLATELET
Abs Immature Granulocytes: 0.04 10*3/uL (ref 0.00–0.07)
Basophils Absolute: 0 10*3/uL (ref 0.0–0.1)
Basophils Relative: 0 %
Eosinophils Absolute: 0 10*3/uL (ref 0.0–0.5)
Eosinophils Relative: 0 %
HCT: 44.6 % (ref 39.0–52.0)
Hemoglobin: 14.8 g/dL (ref 13.0–17.0)
Immature Granulocytes: 0 %
Lymphocytes Relative: 4 %
Lymphs Abs: 0.5 10*3/uL — ABNORMAL LOW (ref 0.7–4.0)
MCH: 30 pg (ref 26.0–34.0)
MCHC: 33.2 g/dL (ref 30.0–36.0)
MCV: 90.3 fL (ref 80.0–100.0)
Monocytes Absolute: 0.8 10*3/uL (ref 0.1–1.0)
Monocytes Relative: 6 %
Neutro Abs: 11.1 10*3/uL — ABNORMAL HIGH (ref 1.7–7.7)
Neutrophils Relative %: 90 %
Platelets: 284 10*3/uL (ref 150–400)
RBC: 4.94 MIL/uL (ref 4.22–5.81)
RDW: 11.7 % (ref 11.5–15.5)
WBC: 12.6 10*3/uL — ABNORMAL HIGH (ref 4.0–10.5)
nRBC: 0 % (ref 0.0–0.2)

## 2019-02-20 MED ORDER — HYDROCODONE-ACETAMINOPHEN 5-325 MG PO TABS: 2.0000 | ORAL_TABLET | ORAL | 0 refills | Status: AC | PRN

## 2019-02-20 MED ORDER — ONDANSETRON HCL 4 MG/2ML IJ SOLN
4.0000 mg | Freq: Once | INTRAMUSCULAR | Status: AC
  Administered 2019-02-20: 4 mg via INTRAVENOUS
  Filled 2019-02-20: qty 2

## 2019-02-20 MED ORDER — KETOROLAC TROMETHAMINE 30 MG/ML IJ SOLN
30.0000 mg | Freq: Once | INTRAMUSCULAR | Status: AC
  Administered 2019-02-20: 30 mg via INTRAVENOUS
  Filled 2019-02-20: qty 1

## 2019-02-20 MED ORDER — TAMSULOSIN HCL 0.4 MG PO CAPS: 0.4000 mg | ORAL_CAPSULE | Freq: Every day | ORAL | 0 refills | Status: AC

## 2019-02-20 NOTE — ED Notes (Signed)
Pt verbalizes was seen here last week and diagnosed with left kidney stone; told to return if pain did not go away. Pt also verbalizing nausea. Pt has not followed up with urology; per pt.

## 2019-02-20 NOTE — ED Provider Notes (Signed)
[facility]-EMERGENCY DEPT Provider Note   CSN: [id number] Arrival date & time: 02/20/19  1411     History   Chief Complaint Chief Complaint  Patient presents with  . Flank Pain    HPI Jahrell Muniz Darlin DropGualberto Newton is a 18 y.o. male.     18 year old male who presents with left flank pain and nausea.  Patient was evaluated here on 10/9 and diagnosed with left kidney stone.  He was sent home with nausea medication and medicine to help the stone pass and initially was doing well.  He reports that this morning he began having pain again and it has progressively worsened throughout the day.  It eventually got so severe that he had to come back to the ED.  He took Tylenol earlier today.  He reports nausea but no vomiting.  He reports some mild burning pain right at the end of voiding but denies any hematuria.  No fevers.  The history is provided by the patient.  Flank Pain    History reviewed. No pertinent past medical history.  Patient Active Problem List   Diagnosis Date Noted  . Ankle syndesmosis disruption, right, initial encounter 08/10/2018  . Bimalleolar ankle fracture, right, closed, initial encounter 08/04/2018    Past Surgical History:  Procedure Laterality Date  . ORIF ANKLE FRACTURE Right 08/05/2018   Procedure: OPEN REDUCTION INTERNAL FIXATION (ORIF) ANKLE FRACTURE;  Surgeon: Roby LoftsHaddix, Kevin P, MD;  Location: [facility] OR;  Service: Orthopedics;  Laterality: Right;        Home Medications    Prior to Admission medications   Medication Sig Start Date End Date Taking? Authorizing Provider  acetaminophen (TYLENOL) 325 MG tablet Take 650 mg by mouth every 6 (six) hours as needed for mild pain or headache.   Yes [provider]  HYDROcodone-acetaminophen (NORCO/VICODIN) 5-325 MG tablet Take 2 tablets by mouth every 4 (four) hours as needed. 02/20/19   , Ambrose Finlandachel Morgan, MD  methocarbamol (ROBAXIN-750) 750 MG tablet Take 1 tablet (750 mg  total) by mouth every 6 (six) hours as needed for muscle spasms. Patient not taking: Reported on 02/10/2019 08/05/18   Despina HiddenYacobi, Sarah A, PA-C  ondansetron (ZOFRAN ODT) 4 MG disintegrating tablet Take 1 tablet (4 mg total) by mouth every 8 (eight) hours as needed for nausea or vomiting. Patient not taking: Reported on 02/20/2019 02/10/19   Harlene SaltsMorelli, Brandon A, PA-C  oxyCODONE (ROXICODONE) 5 MG immediate release tablet Take 1 tablet (5 mg total) by mouth every 4 (four) hours as needed for moderate pain or severe pain. Patient not taking: Reported on 02/10/2019 08/05/18   Despina HiddenYacobi, Sarah A, PA-C  tamsulosin (FLOMAX) 0.4 MG CAPS capsule Take 1 capsule (0.4 mg total) by mouth daily. 02/20/19   , Ambrose Finlandachel Morgan, MD    Family History No family history on file.  Social History Social History   Tobacco Use  . Smoking status: Never Smoker  . Smokeless tobacco: Never Used  Substance Use Topics  . Alcohol use: Not Currently  . Drug use: Not Currently     Allergies   Patient has no known allergies.   Review of Systems Review of Systems  Genitourinary: Positive for flank pain.   All other systems reviewed and are negative except that which was mentioned in HPI  Physical Exam Updated Vital Signs BP (!) 108/52   Pulse (!) 51   Temp 98.3 F (36.8 C) (Oral)   Resp 16   SpO2 100%   Physical Exam Vitals  signs and nursing note reviewed.  Constitutional:      General: He is not in acute distress.    Appearance: He is well-developed.  HENT:     Head: Normocephalic and atraumatic.  Eyes:     Conjunctiva/sclera: Conjunctivae normal.  Neck:     Musculoskeletal: Neck supple.  Cardiovascular:     Rate and Rhythm: Normal rate and regular rhythm.     Heart sounds: Normal heart sounds. No murmur.  Pulmonary:     Effort: Pulmonary effort is normal.     Breath sounds: Normal breath sounds.  Abdominal:     General: Bowel sounds are normal. There is no distension.     Palpations: Abdomen is  soft.     Tenderness: There is no abdominal tenderness.  Skin:    General: Skin is warm and dry.  Neurological:     Mental Status: He is alert and oriented to person, place, and time.     Comments: Fluent speech  Psychiatric:        Judgment: Judgment normal.      ED Treatments / Results  Labs (all labs ordered are listed, but only abnormal results are displayed) Labs Reviewed  URINALYSIS, ROUTINE W REFLEX MICROSCOPIC - Abnormal; Notable for the following components:      Result Value   Color, Urine STRAW (*)    Hgb urine dipstick SMALL (*)    Ketones, ur 5 (*)    Protein, ur 30 (*)    All other components within normal limits  BASIC METABOLIC PANEL - Abnormal; Notable for the following components:   Glucose, Bld 114 (*)    Creatinine, Ser 1.33 (*)    All other components within normal limits  CBC WITH DIFFERENTIAL/PLATELET - Abnormal; Notable for the following components:   WBC 12.6 (*)    Neutro Abs 11.1 (*)    Lymphs Abs 0.5 (*)    All other components within normal limits    EKG None  Radiology No results found.  Procedures Procedures (including critical care time)  Medications Ordered in ED Medications  ondansetron (ZOFRAN) injection 4 mg (4 mg Intravenous Given 02/20/19 1905)  ketorolac (TORADOL) 30 MG/ML injection 30 mg (30 mg Intravenous Given 02/20/19 2036)     Initial Impression / Assessment and Plan / ED Course  I have reviewed the triage vital signs and the nursing notes.  Pertinent labs  that were available during my care of the patient were reviewed by me and considered in my medical decision making (see chart for details).      Well-appearing on exam, afebrile.  I reviewed imaging from previous visit which showed 5 mm stone at the left UVJ.  Labs today show urine with blood, no evidence of infection, mild bump in creatinine at 1.33, WBC 12.6.  Pain well controlled with Toradol alone.  I discussed with urologist on-call, Dr. Lynnae Sandhoff, who  states pt should be able to call clinic and get prompt f/u appt. will continue Flomax for now.  I discussed follow-up plan with patient and provided with Norco to use in addition to Zofran to control symptoms.  I have extensively reviewed return precautions with him and he voiced understanding.  Final Clinical Impressions(s) / ED Diagnoses   Final diagnoses:  Kidney stone    ED Discharge Orders         Ordered    HYDROcodone-acetaminophen (NORCO/VICODIN) 5-325 MG tablet  Every 4 hours PRN     02/20/19 2222    tamsulosin (  FLOMAX) 0.4 MG CAPS capsule  Daily     02/20/19 2229           , Wenda Overland, MD 02/20/19 [phone]

## 2020-12-22 ENCOUNTER — Other Ambulatory Visit: Payer: Self-pay

## 2020-12-22 ENCOUNTER — Emergency Department (HOSPITAL_COMMUNITY)
Admission: EM | Admit: 2020-12-22 | Discharge: 2020-12-22 | Disposition: A | Discharge status: Home / Self Care | Attending: Emergency Medicine | Admitting: Emergency Medicine

## 2020-12-22 ENCOUNTER — Emergency Department (HOSPITAL_COMMUNITY)

## 2020-12-22 ENCOUNTER — Encounter (HOSPITAL_COMMUNITY): Payer: Self-pay | Admitting: Oncology

## 2020-12-22 DIAGNOSIS — H538 Other visual disturbances: Secondary | ICD-10-CM | POA: Diagnosis not present

## 2020-12-22 DIAGNOSIS — L0201 Cutaneous abscess of face: Secondary | ICD-10-CM | POA: Diagnosis present

## 2020-12-22 DIAGNOSIS — L02214 Cutaneous abscess of groin: Secondary | ICD-10-CM | POA: Insufficient documentation

## 2020-12-22 DIAGNOSIS — Z23 Encounter for immunization: Secondary | ICD-10-CM | POA: Diagnosis not present

## 2020-12-22 LAB — CBC WITH DIFFERENTIAL/PLATELET
Abs Immature Granulocytes: 0.04 10*3/uL (ref 0.00–0.07)
Basophils Absolute: 0.1 10*3/uL (ref 0.0–0.1)
Basophils Relative: 0 %
Eosinophils Absolute: 0.2 10*3/uL (ref 0.0–0.5)
Eosinophils Relative: 1 %
HCT: 42.2 % (ref 39.0–52.0)
Hemoglobin: 13.9 g/dL (ref 13.0–17.0)
Immature Granulocytes: 0 %
Lymphocytes Relative: 6 %
Lymphs Abs: 0.7 10*3/uL (ref 0.7–4.0)
MCH: 29.9 pg (ref 26.0–34.0)
MCHC: 32.9 g/dL (ref 30.0–36.0)
MCV: 90.8 fL (ref 80.0–100.0)
Monocytes Absolute: 0.7 10*3/uL (ref 0.1–1.0)
Monocytes Relative: 6 %
Neutro Abs: 10.4 10*3/uL — ABNORMAL HIGH (ref 1.7–7.7)
Neutrophils Relative %: 87 %
Platelets: 289 10*3/uL (ref 150–400)
RBC: 4.65 MIL/uL (ref 4.22–5.81)
RDW: 12.2 % (ref 11.5–15.5)
WBC: 12.1 10*3/uL — ABNORMAL HIGH (ref 4.0–10.5)
nRBC: 0 % (ref 0.0–0.2)

## 2020-12-22 LAB — BASIC METABOLIC PANEL
Anion gap: 8 (ref 5–15)
BUN: 15 mg/dL (ref 6–20)
CO2: 30 mmol/L (ref 22–32)
Calcium: 9.5 mg/dL (ref 8.9–10.3)
Chloride: 101 mmol/L (ref 98–111)
Creatinine, Ser: 0.99 mg/dL (ref 0.61–1.24)
GFR, Estimated: >60 mL/min (ref >60)
Glucose, Bld: 99 mg/dL (ref 70–99)
Potassium: 4.2 mmol/L (ref 3.5–5.1)
Sodium: 139 mmol/L (ref 135–145)

## 2020-12-22 LAB — URINALYSIS, ROUTINE W REFLEX MICROSCOPIC
Bilirubin Urine: NEGATIVE
Glucose, UA: NEGATIVE mg/dL
Ketones, ur: NEGATIVE mg/dL
Leukocytes,Ua: NEGATIVE
Nitrite: NEGATIVE
Protein, ur: NEGATIVE mg/dL
Specific Gravity, Urine: 1.025 (ref 1.005–1.030)
pH: 7 (ref 5.0–8.0)

## 2020-12-22 MED ORDER — DOXYCYCLINE HYCLATE 100 MG PO CAPS: 100.0000 mg | ORAL_CAPSULE | Freq: Two times a day (BID) | ORAL | 0 refills | Status: AC

## 2020-12-22 MED ORDER — LIDOCAINE-EPINEPHRINE (PF) 2 %-1:200000 IJ SOLN
20.0000 mL | Freq: Once | INTRAMUSCULAR | Status: AC
  Administered 2020-12-22: 20 mL
  Filled 2020-12-22: qty 20

## 2020-12-22 MED ORDER — IOHEXOL 350 MG/ML SOLN
75.0000 mL | Freq: Once | INTRAVENOUS | Status: AC | PRN
  Administered 2020-12-22: 75 mL via INTRAVENOUS

## 2020-12-22 MED ORDER — TETANUS-DIPHTH-ACELL PERTUSSIS 5-2.5-18.5 LF-MCG/0.5 IM SUSY
0.5000 mL | PREFILLED_SYRINGE | Freq: Once | INTRAMUSCULAR | Status: AC
  Administered 2020-12-22: 0.5 mL via INTRAMUSCULAR
  Filled 2020-12-22: qty 0.5

## 2020-12-22 NOTE — ED Notes (Signed)
Patient transported to CT 

## 2020-12-22 NOTE — Discharge Instructions (Addendum)
You came to the emergency department today to have your left groin and left face swelling evaluated.  The swelling you have was due to skin abscesses.  Incision and drainage was performed to help treat these abscesses.  Packing was placed to the abscess in your groin.  You will need to go to your primary care doctor, urgent care, or return to the emergency department in 3 days to have packing removed and wound rechecked.  If packing falls out prior to 3 days do not reinsert, however still follow-up with provider in 3 days for wound recheck.  Please take the antibiotic, Doxycycline, every 12 hours until gone. A side effect of this medication includes hypersensitivity to the suns rays - please take measures to protect your skin from the sun while taking this medication.  Please take Ibuprofen (Advil, motrin) and Tylenol (acetaminophen) to relieve your pain.    You may take up to 600 MG (3 pills) of normal strength ibuprofen every 8 hours as needed.   You make take tylenol, up to 1,000 mg (two extra strength pills) every 8 hours as needed.   It is safe to take ibuprofen and tylenol at the same time as they work differently.   Do not take more than 3,000 mg tylenol in a 24 hour period (not more than one dose every 8 hours.  Please check all medication labels as many medications such as pain and cold medications may contain tylenol.  Do not drink alcohol while taking these medications.  Do not take other NSAID'S while taking ibuprofen (such as aleve or naproxen).  Please take ibuprofen with food to decrease stomach upset.  Get help right away if you: Have severe pain. See red streaks on your skin spreading away from the abscess.

## 2020-12-22 NOTE — ED Provider Notes (Addendum)
[facility]-EMERGENCY DEPT Provider Note   CSN: [id number] Arrival date & time: 12/22/20  1043     History Chief Complaint  Patient presents with  . Lymphadenopathy    Randy Parks is a 20 y.o. male with no pertinent past medical history.  Patient reports Emergency Department with chief complaint of swelling to left side of face and left groin.    Patient reports that left groin swelling started on Tuesday or Wednesday.  Swelling and pain have increased "a little," over that time.  Patient rates pain 4/10 on the pain scale.  Pain is worse with touch or movement.  Improvement in pain with over-the-counter pain medication.  Patient denies any recent heavy lifting or traumatic injuries.  Patient denies any purulent discharge.  Left-sided facial swelling started Friday night.  Swelling has gotten progressively worse over this time.  Patient complains of pain to area of swelling.  Patient rates pain 2/10 on pain scale.  Pain is worse with touch.  Pain is improved with Tylenol.  Patient states that he had a "pimple," there and popped it, minimal purulent discharge after popping pimple.  Reports left eye blurriness due to swelling.    Patient reports he has not been sexually active in 2 years.  HPI     History reviewed. No pertinent past medical history.  Patient Active Problem List   Diagnosis Date Noted  . Ankle syndesmosis disruption, right, initial encounter 08/10/2018  . Bimalleolar ankle fracture, right, closed, initial encounter 08/04/2018    Past Surgical History:  Procedure Laterality Date  . ORIF ANKLE FRACTURE Right 08/05/2018   Procedure: OPEN REDUCTION INTERNAL FIXATION (ORIF) ANKLE FRACTURE;  Surgeon: Roby Lofts, MD;  Location: [facility] OR;  Service: Orthopedics;  Laterality: Right;       History reviewed. No pertinent family history.  Social History   Tobacco Use  . Smoking status: Never  . Smokeless tobacco: Never  Vaping  Use  . Vaping Use: Never used  Substance Use Topics  . Alcohol use: Not Currently  . Drug use: Not Currently    Home Medications Prior to Admission medications   Medication Sig Start Date End Date Taking? Authorizing Provider  acetaminophen (TYLENOL) 325 MG tablet Take 650 mg by mouth every 6 (six) hours as needed for mild pain or headache.    [provider]  HYDROcodone-acetaminophen (NORCO/VICODIN) 5-325 MG tablet Take 2 tablets by mouth every 4 (four) hours as needed. 02/20/19   Little, Ambrose Finland, MD  methocarbamol (ROBAXIN-750) 750 MG tablet Take 1 tablet (750 mg total) by mouth every 6 (six) hours as needed for muscle spasms. Patient not taking: Reported on 02/10/2019 08/05/18   Despina Hidden, PA-C  ondansetron (ZOFRAN ODT) 4 MG disintegrating tablet Take 1 tablet (4 mg total) by mouth every 8 (eight) hours as needed for nausea or vomiting. Patient not taking: Reported on 02/20/2019 02/10/19   Harlene Salts A, PA-C  oxyCODONE (ROXICODONE) 5 MG immediate release tablet Take 1 tablet (5 mg total) by mouth every 4 (four) hours as needed for moderate pain or severe pain. Patient not taking: Reported on 02/10/2019 08/05/18   Despina Hidden, PA-C  tamsulosin (FLOMAX) 0.4 MG CAPS capsule Take 1 capsule (0.4 mg total) by mouth daily. 02/20/19   Little, Ambrose Finland, MD    Allergies    Patient has no known allergies.  Review of Systems   Review of Systems  Constitutional:  Negative for chills and fever.  HENT:  Positive for facial swelling. Negative for dental problem, drooling, ear discharge, ear pain, sore throat and trouble swallowing.   Eyes:  Positive for visual disturbance. Negative for photophobia, pain, discharge, redness and itching.  Respiratory:  Negative for shortness of breath.   Cardiovascular:  Negative for chest pain.  Gastrointestinal:  Negative for abdominal pain, nausea and vomiting.  Genitourinary:  Negative for difficulty urinating, dysuria, flank pain,  frequency, genital sores, hematuria, penile discharge, penile pain, penile swelling, scrotal swelling, testicular pain and urgency.  Musculoskeletal:  Negative for back pain, neck pain and neck stiffness.  Skin:  Negative for color change and rash.  Neurological:  Negative for dizziness, tremors, seizures, syncope, facial asymmetry, speech difficulty, weakness, light-headedness, numbness and headaches.  Psychiatric/Behavioral:  Negative for confusion.    Physical Exam Updated Vital Signs BP 114/64 (BP Location: Left Arm)   Pulse 74   Temp 98.4 F (36.9 C) (Oral)   Resp 16   Ht  (1.854 m)   Wt 76.7 kg   SpO2 100%   BMI 22.30 kg/m   Physical Exam Vitals and nursing note reviewed. Exam conducted with a chaperone present (Male nurse tech present as chaperone).  Constitutional:      General: He is not in acute distress.    Appearance: He is not ill-appearing, toxic-appearing or diaphoretic.  HENT:     Head: Normocephalic. Left periorbital erythema present. No raccoon eyes, Battle's sign, abrasion, contusion, masses, right periorbital erythema or laceration. Hair is normal.     Jaw: No trismus, tenderness, swelling, pain on movement or malocclusion.      Comments: Patient is swelling and fluctuance to left temple    Mouth/Throat:     Mouth: Mucous membranes are moist. No lacerations, oral lesions or angioedema.     Tongue: No lesions. Tongue does not deviate from midline.     Palate: No mass and lesions.     Pharynx: Oropharynx is clear. Uvula midline. No pharyngeal swelling, oropharyngeal exudate, posterior oropharyngeal erythema or uvula swelling.     Tonsils: No tonsillar exudate or tonsillar abscesses. 1+ on the right. 1+ on the left.  Eyes:     General: No scleral icterus.       Right eye: No discharge.        Left eye: No discharge.  Cardiovascular:     Rate and Rhythm: Normal rate.  Pulmonary:     Effort: Pulmonary effort is normal.  Abdominal:     Hernia: There is  no hernia in the left inguinal area or right inguinal area.  Genitourinary:    Pubic Area: No rash or pubic lice.      Penis: Normal and uncircumcised. No phimosis, paraphimosis, hypospadias, erythema, tenderness, discharge, swelling or lesions.      Testes: Normal. Cremasteric reflex is present.     Epididymis:     Right: Normal.     Left: Normal.     Tanner stage (genital): 5.       Comments: Patient has swelling, erythema, and fluctuance noted to left groin. Musculoskeletal:     Cervical back: Full passive range of motion without pain, normal range of motion and neck supple. No edema, erythema, signs of trauma, rigidity, torticollis or crepitus. No pain with movement, spinous process tenderness or muscular tenderness. Normal range of motion.  Lymphadenopathy:     Lower Body: No right inguinal adenopathy. No left inguinal adenopathy.  Skin:    General: Skin is warm and dry.  Neurological:  General: No focal deficit present.     Mental Status: He is alert.  Psychiatric:        Behavior: Behavior is cooperative.    ED Results / Procedures / Treatments   Labs (all labs ordered are listed, but only abnormal results are displayed) Labs Reviewed  CBC WITH DIFFERENTIAL/PLATELET - Abnormal; Notable for the following components:      Result Value   WBC 12.1 (*)    Neutro Abs 10.4 (*)    All other components within normal limits  URINALYSIS, ROUTINE W REFLEX MICROSCOPIC - Abnormal; Notable for the following components:   APPearance HAZY (*)    Hgb urine dipstick SMALL (*)    Bacteria, UA RARE (*)    All other components within normal limits  BASIC METABOLIC PANEL    EKG None  Radiology CT Maxillofacial W Contrast  Result Date: 12/22/2020 CLINICAL DATA:  Cellulitis, face; left temporal swelling EXAM: CT MAXILLOFACIAL WITH CONTRAST TECHNIQUE: Multidetector CT imaging of the maxillofacial structures was performed with intravenous contrast. Multiplanar CT image reconstructions  were also generated. CONTRAST:  52mL OMNIPAQUE IOHEXOL 350 MG/ML SOLN COMPARISON:  None. FINDINGS: Osseous: No acute abnormality. Temporomandibular joints and upper cervical spine are unremarkable. Orbits: Unremarkable. Sinuses: No significant opacification. Soft tissues: Left temporal subcutaneous soft tissue swelling. No evidence of abscess. Limited intracranial: No abnormal enhancement. IMPRESSION: Left temporal soft tissue swelling.  No abscess. Electronically Signed   By: Guadlupe Spanish M.D.   On: 12/22/2020 15:15    Procedures .Marland KitchenIncision and Drainage  Date/Time: 12/22/2020 9:33 PM Performed by: Haskel Schroeder, PA-C Authorized by: Haskel Schroeder, PA-C   Consent:    Consent obtained:  Verbal   Consent given by:  Patient   Risks discussed:  Bleeding, incomplete drainage, pain, damage to other organs and infection   Alternatives discussed:  No treatment Universal protocol:    Procedure explained and questions answered to patient or proxy's satisfaction: yes     Immediately prior to procedure, a time out was called: yes     Patient identity confirmed:  Verbally with patient and arm band Location:    Type:  Abscess   Location:  Trunk   Trunk location: groin. Pre-procedure details:    Skin preparation:  Povidone-iodine Anesthesia:    Anesthesia method:  Local infiltration   Local anesthetic:  Lidocaine 2% WITH epi Procedure type:    Complexity:  Complex Procedure details:    Incision types:  Cruciate   Incision depth:  Subcutaneous   Wound management:  Probed and deloculated, irrigated with saline and extensive cleaning   Drainage:  Purulent   Drainage amount:  Copious   Packing materials:  1/4 in gauze Post-procedure details:    Procedure completion:  Tolerated well, no immediate complications .Marland KitchenIncision and Drainage  Date/Time: 12/22/2020 9:35 PM Performed by: Haskel Schroeder, PA-C Authorized by: Haskel Schroeder, PA-C   Consent:    Consent obtained:   Verbal   Consent given by:  Patient   Risks discussed:  Bleeding, incomplete drainage, pain, damage to other organs and infection   Alternatives discussed:  No treatment Universal protocol:    Procedure explained and questions answered to patient or proxy's satisfaction: yes     Imaging studies available: yes     Immediately prior to procedure, a time out was called: yes     Patient identity confirmed:  Verbally with patient and arm band Location:    Type:  Abscess   Location:  Head  Head location:  Face Pre-procedure details:    Skin preparation:  Povidone-iodine Anesthesia:    Anesthesia method:  Local infiltration   Local anesthetic:  Lidocaine 2% WITH epi Procedure type:    Complexity:  Simple Procedure details:    Incision types:  Single straight   Incision depth:  Subcutaneous   Wound management:  Probed and deloculated   Drainage:  Purulent   Drainage amount:  Moderate   Packing materials:  None Post-procedure details:    Procedure completion:  Tolerated well, no immediate complications   Medications Ordered in ED Medications  iohexol (OMNIPAQUE) 350 MG/ML injection 75 mL (75 mLs Intravenous Contrast Given 12/22/20 1442)    ED Course  I have reviewed the triage vital signs and the nursing notes.  Pertinent labs & imaging results that were available during my care of the patient were reviewed by me and considered in my medical decision making (see chart for details).    MDM Rules/Calculators/A&P                           Alert 20 year old male in no acute distress, nontoxic appearing.  Presents with complaint of swelling to left side of face and left groin.  Basic lab work, UA, and CT maxillofacial with contrast were obtained while patient was in triage.  BMP unremarkable CBC shows leukocytosis at 12.1 UA shows no signs of infection or dehydration.  CT maxillofacial shows left temporal soft tissue swelling, orbits are unremarkable, sinuses are  unremarkable.  Suspect that patient's areas of swelling are due to skin abscess, will perform I&D in the emergency department.  Due to surrounding erythema will discharge patient on 7-day course of doxycycline.  Patient to follow-up in 3 days for removal of packing material and wound recheck.  Discussed results, findings, treatment and follow up. Patient advised of return precautions. Patient verbalized understanding and agreed with plan.   Final Clinical Impression(s) / ED Diagnoses Final diagnoses:  Abscess of face  Abscess, groin    Rx / DC Orders ED Discharge Orders          Ordered    doxycycline (VIBRAMYCIN) 100 MG capsule  2 times daily        12/22/20 1631             Haskel Schroeder, PA-C 12/22/20 2135    Haskel Schroeder, PA-C 12/22/20 2137    Cathren Laine, MD 12/25/20 1535

## 2020-12-22 NOTE — ED Triage Notes (Signed)
Pt reports being tx for ear infection last week.  Those sx have resolved.  Now pt presents d/t swelling in left groin and left side of face.

## 2020-12-22 NOTE — ED Provider Notes (Signed)
Emergency Medicine Provider Triage Evaluation Note  Randy Parks , a 20 y.o. male  was evaluated in triage.  Pt complains of left-sided facial swelling and swelling/pain to left groin.  Patient reports that left-sided facial swelling has gradually developed over the last week.  Endorses blurry vision to left eye.  Patient also reports pain and swelling to the left groin.  Pain is only present with touch or movement.  Patient denies any fevers, chills, neck pain, neck stiffness, ear pain, ear discharge, sore throat, trouble swallowing, vision loss, pain with eye movement, dysuria, hematuria, urinary frequency, genital sores or lesions.  Patient reports that he has not been sexually active in 2 years.  Review of Systems  Positive: Left-sided facial swelling, left-sided groin swelling Negative: fevers, chills, neck pain, neck stiffness, ear pain, ear discharge, sore throat, trouble swallowing, vision loss, pain with eye movement, dysuria, hematuria, urinary frequency, genital sores or lesions.  Physical Exam  BP 114/64 (BP Location: Left Arm)   Pulse 74   Temp 98.4 F (36.9 C) (Oral)   Resp 16   Ht 6\' 1"  (1.854 m)   Wt 76.7 kg   SpO2 100%   BMI 22.30 kg/m  Gen:   Awake, no distress   Resp:  Normal effort  MSK:   Moves extremities without difficulty  Other:  EOM intact bilaterally with no complaints of pain.  Pupils PERRL.  Patient has swelling to left side of his face, small wound noted to preauricular space.  Abdomen soft, nondistended, nontender.  Medical Decision Making  Medically screening exam initiated at 11:24 AM.  Appropriate orders placed.  Randy Parks was informed that the remainder of the evaluation will be completed by another provider, this initial triage assessment does not replace that evaluation, and the importance of remaining in the ED until their evaluation is complete.  The patient appears stable so that the remainder of the work up  may be completed by another provider.      Darlin Drop 12/22/20 1126    12/24/20, MD 12/22/20 [phone]

## 2021-12-26 IMAGING — CT CT MAXILLOFACIAL W/ CM
3 series · 14 of 47 positions shown, 16 images · IV contrast (omnipaque)
Comparison: None.

CLINICAL DATA: Cellulitis, face; left temporal swelling

EXAM:
CT MAXILLOFACIAL WITH CONTRAST
TECHNIQUE: Multidetector CT imaging of the maxillofacial structures was
performed with intravenous contrast. Multiplanar CT image
reconstructions were also generated.
CONTRAST:  75mL OMNIPAQUE IOHEXOL 350 MG/ML SOLN

[Series 3: max soft · axial · 0.44mm/px · z∈[-242,-90]mm · 8 of 90 slices shown, 10 images]
[im 7/90  brain]
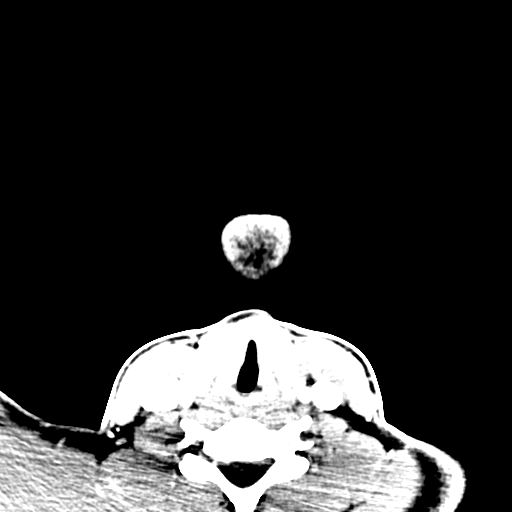
[im 7/90  bone]
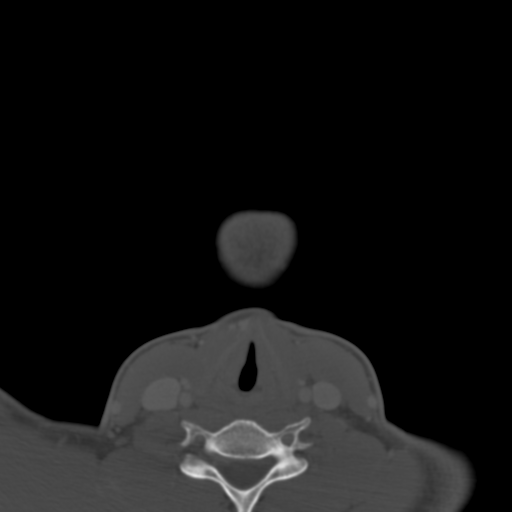
[im 19/90  bone]
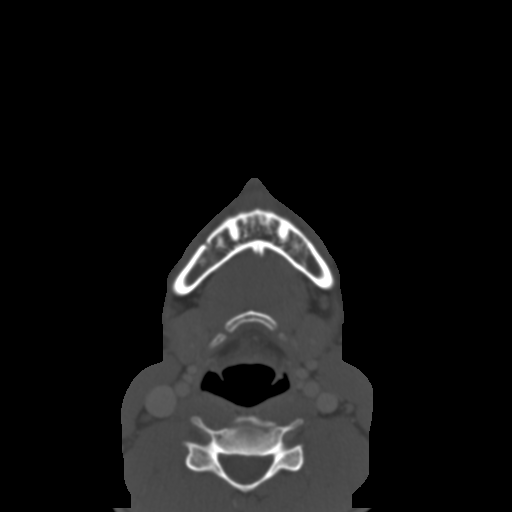
[im 28/90  bone]
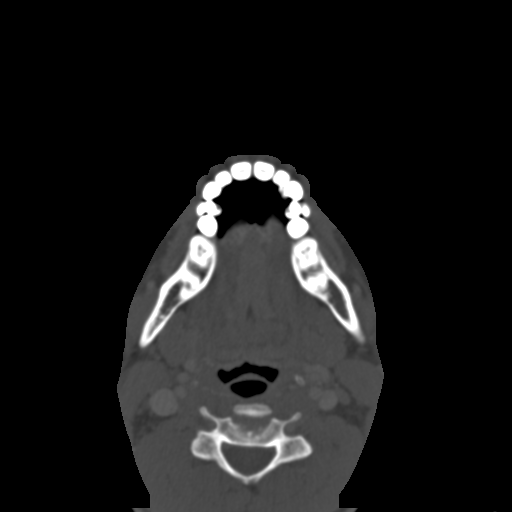
[im 40/90  bone]
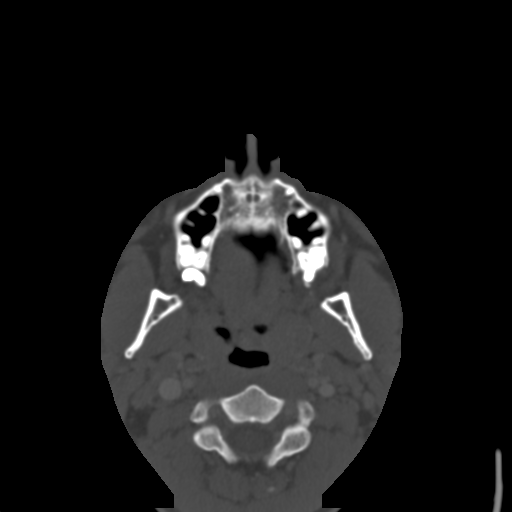
[im 50/90  brain]
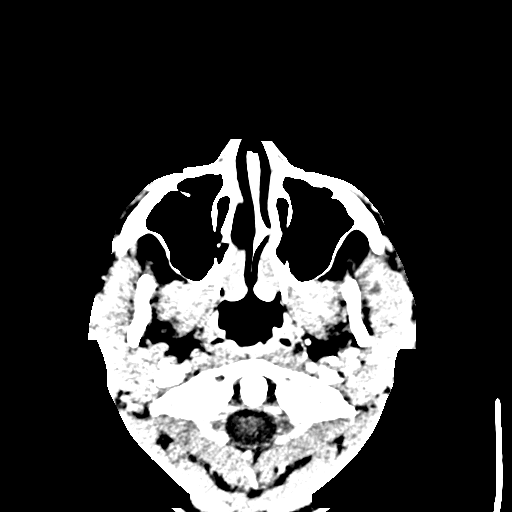
[im 50/90  bone]
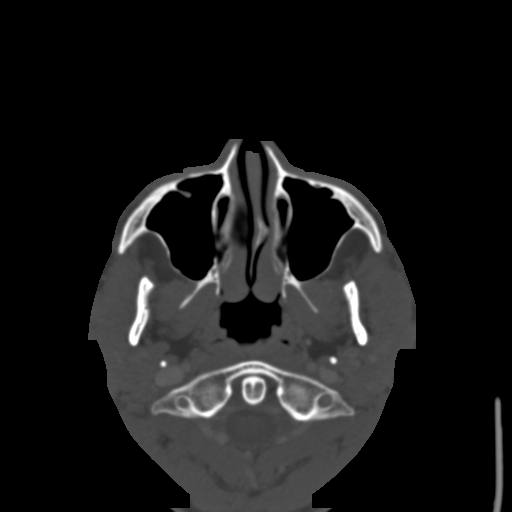
[im 62/90  bone]
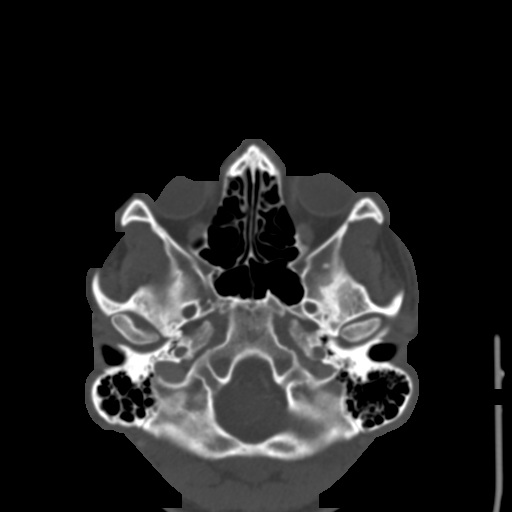
[im 71/90  bone]
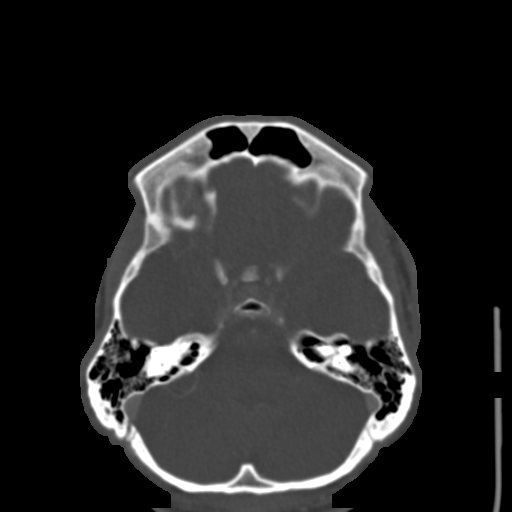
[im 83/90  bone]
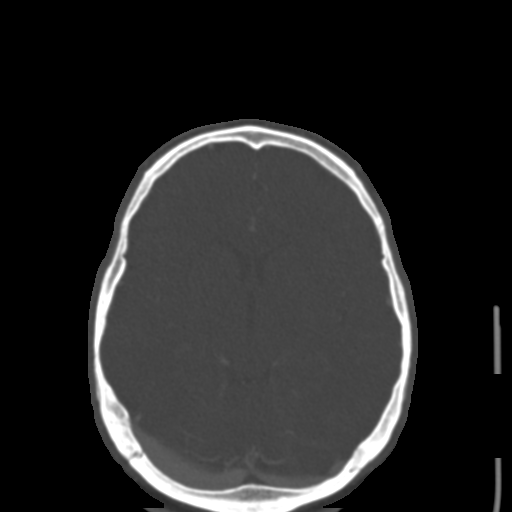

[Series 7: coronal soft · coronal · 0.38mm/px · 3 of 85 slices shown]
[im 29/85  bone]
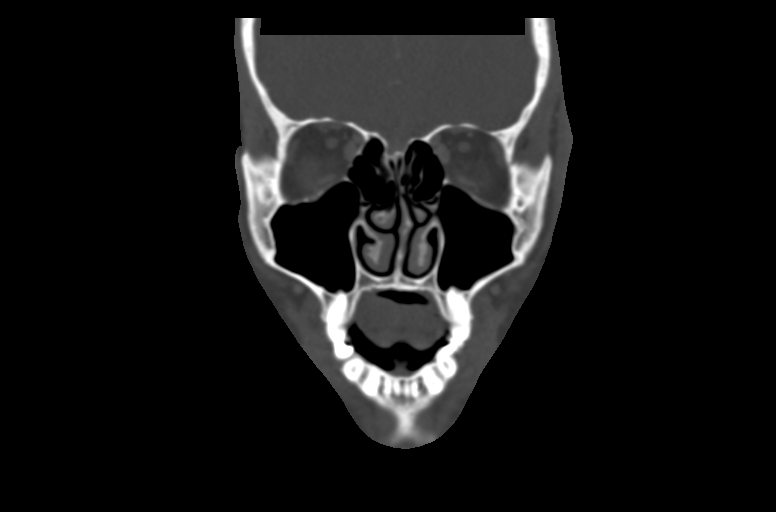
[im 38/85  bone]
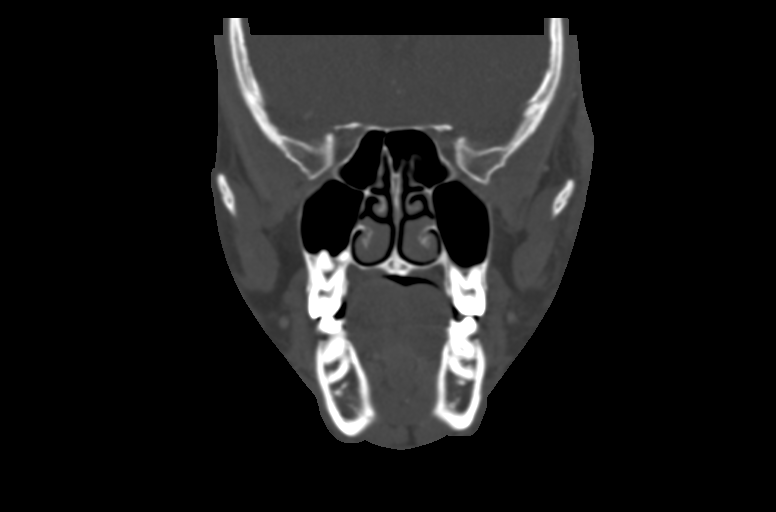
[im 47/85  bone]
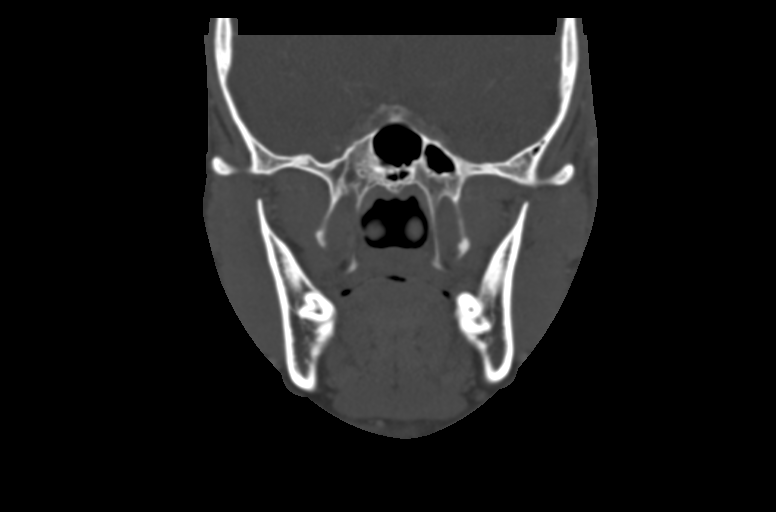

[Series 8: sagittal soft · sagittal · 0.38mm/px · 3 of 83 slices shown]
[im 28/83  bone]
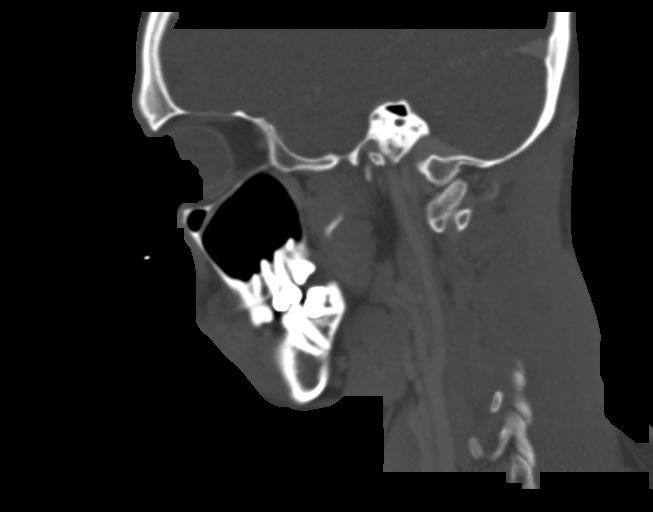
[im 42/83  bone]
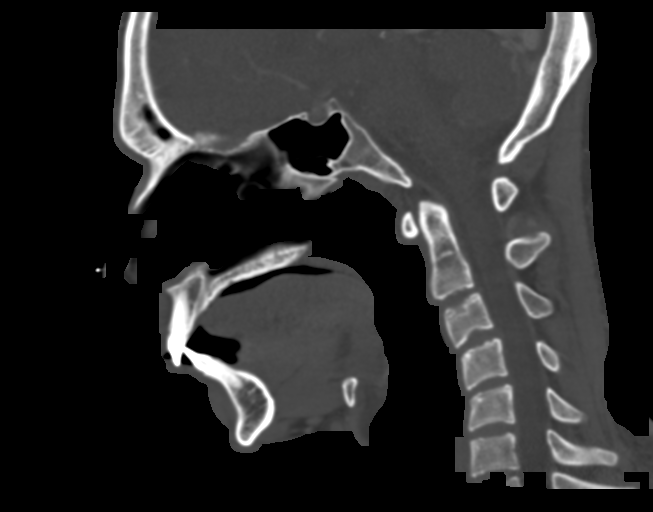
[im 55/83  bone]
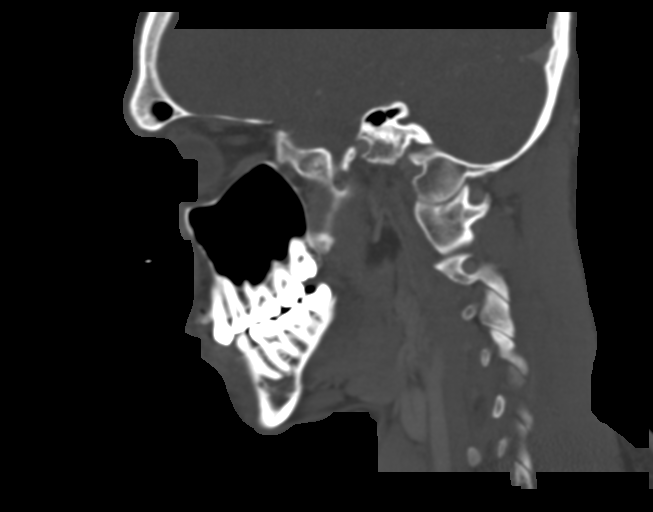

[14 of 47 positions shown; findings below may reference images not displayed]

FINDINGS: Osseous: No acute abnormality. Temporomandibular joints and upper
cervical spine are unremarkable.

Orbits: Unremarkable.

Sinuses: No significant opacification.

Soft tissues: Left temporal subcutaneous soft tissue swelling. No
evidence of abscess.

Limited intracranial: No abnormal enhancement.
IMPRESSION: Left temporal soft tissue swelling.  No abscess.

## 2022-09-07 ENCOUNTER — Telehealth: Payer: Self-pay

## 2022-09-07 NOTE — Telephone Encounter (Signed)
LVM for patient to call back. AS, CMA
